# Patient Record
Sex: Male | Born: 1990 | Race: White | Hispanic: No | Marital: Married | Smoking: Never smoker
Health system: Southern US, Community
[De-identification: ages and names within clinical notes are randomized; demographics above are authoritative.]

## PROBLEM LIST (undated history)

## (undated) DIAGNOSIS — I514 Myocarditis, unspecified: Secondary | ICD-10-CM

---
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
## 2018-10-03 ENCOUNTER — Emergency Department (HOSPITAL_COMMUNITY): Payer: 59

## 2018-10-03 ENCOUNTER — Emergency Department (HOSPITAL_COMMUNITY)
Admission: EM | Admit: 2018-10-03 | Discharge: 2018-10-03 | Disposition: A | Discharge status: Home / Self Care | Payer: 59 | Attending: Emergency Medicine | Admitting: Emergency Medicine

## 2018-10-03 ENCOUNTER — Other Ambulatory Visit: Payer: Self-pay

## 2018-10-03 ENCOUNTER — Encounter (HOSPITAL_COMMUNITY): Payer: Self-pay

## 2018-10-03 DIAGNOSIS — N50812 Left testicular pain: Secondary | ICD-10-CM | POA: Diagnosis present

## 2018-10-03 DIAGNOSIS — N451 Epididymitis: Secondary | ICD-10-CM | POA: Diagnosis not present

## 2018-10-03 DIAGNOSIS — N50819 Testicular pain, unspecified: Secondary | ICD-10-CM

## 2018-10-03 MED ORDER — IBUPROFEN 600 MG PO TABS: 600.0000 mg | ORAL_TABLET | Freq: Three times a day (TID) | ORAL | 0 refills | Status: AC | PRN

## 2018-10-03 MED ORDER — CIPROFLOXACIN HCL 500 MG PO TABS: 500.0000 mg | ORAL_TABLET | Freq: Two times a day (BID) | ORAL | 0 refills | Status: AC

## 2018-10-03 MED ORDER — OXYCODONE-ACETAMINOPHEN 5-325 MG PO TABS
1.0000 | ORAL_TABLET | Freq: Once | ORAL | Status: AC
  Administered 2018-10-03: 1 via ORAL
  Filled 2018-10-03: qty 1

## 2018-10-03 MED ORDER — IBUPROFEN 400 MG PO TABS
600.0000 mg | ORAL_TABLET | Freq: Once | ORAL | Status: AC
  Administered 2018-10-03: 600 mg via ORAL
  Filled 2018-10-03: qty 1

## 2018-10-03 NOTE — ED Triage Notes (Signed)
Pt reports left testicle pain since Sunday and has worsened since then. [facility] was [facility] from [facility]. Denies any urinary symptoms.

## 2018-10-03 NOTE — ED Notes (Signed)
Patient transported to [facility] 

## 2018-10-03 NOTE — ED Provider Notes (Signed)
[facility] EMERGENCY DEPARTMENT Provider Note   CSN: [id number] Arrival date & time: 10/03/18  1750     History   Chief Complaint Chief Complaint  Patient presents with   Testicle Pain    HPI Alfred Perry is a 28 y.o. male.     HPI Patient is a 28 year old male presents the emergency department the gradual worsening of his left testicular pain over the past 4 days.  He is never had pain like this before.  No new sexual partners.  Denies penile discharge.  No dysuria or urinary frequency.  Unsure if he has any new testicular swelling.  No significant abdominal pain.  Denies fevers or chills.  No nausea vomiting.  Patient is never had pain or discomfort like this before.   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   ** The histories are not reviewed yet. Please review them in the "History" navigator section and refresh this [place].      Home Medications    Prior to Admission medications   Medication Sig Start Date End Date Taking? Authorizing Provider  ciprofloxacin (CIPRO) 500 MG tablet Take 1 tablet (500 mg total) by mouth 2 (two) times daily. 10/03/18   Jola Schmidt, MD  ibuprofen (ADVIL) 600 MG tablet Take 1 tablet (600 mg total) by mouth every 8 (eight) hours as needed. 10/03/18   Jola Schmidt, MD    Family History No family history on file.  Social History Social History   Tobacco Use   Smoking status: Not on file  Substance Use Topics   Alcohol use: Not on file   Drug use: Not on file     Allergies   Patient has no allergy information on record.   Review of Systems Review of Systems  All other systems reviewed and are negative.    Physical Exam Updated Vital Signs BP (!) 138/96 (BP Location: Right Arm)    Pulse 60    Temp 98.5 F (36.9 C) (Oral)    Resp 18    SpO2 100%   Physical Exam Vitals signs and nursing note reviewed.    Constitutional:      Appearance: He is well-developed.  HENT:     Head: Normocephalic.  Neck:     Musculoskeletal: Normal range of motion.  Pulmonary:     Effort: Pulmonary effort is normal.  Abdominal:     General: There is no distension.     Tenderness: There is no abdominal tenderness.  Genitourinary:    Comments: Normal circumcised penis.  Normal appearing scrotum.  Mild tenderness of the left testicle without significant swelling noted.  Mild tenderness at the superior pole of the left testicle as well.  No left inguinal hernia present. Musculoskeletal: Normal range of motion.  Neurological:     Mental Status: He is alert and oriented to person, place, and time.      ED Treatments / Results  Labs (all labs ordered are listed, but only abnormal results are displayed) Labs Reviewed - No data to display  EKG None  Radiology US Scrotum W/doppler  Result Date: 10/03/2018 CLINICAL DATA:  LEFT testicular pain for 5 days. EXAM: SCROTAL ULTRASOUND DOPPLER ULTRASOUND OF THE TESTICLES TECHNIQUE: Complete ultrasound examination of the testicles, epididymis, and other scrotal structures was performed. Color and spectral Doppler ultrasound were also utilized to evaluate blood flow to the testicles. COMPARISON:  None. FINDINGS:  Right testicle Measurements: 5.0 x 2.6 x 3.4 centimeters. No mass or microlithiasis visualized. Left testicle Measurements: 4.9 x 3.0 x 3.4 centimeters. No mass or microlithiasis visualized. Right epididymis:  Normal in size and appearance. Left epididymis:  Small cyst is 0.8 x 0.7 x 0.6 centimeters. Hydrocele:  Small bilateral hydroceles. Varicocele:  Bilateral varicoceles present. Pulsed Doppler interrogation of both testes demonstrates normal low resistance arterial and venous waveforms bilaterally. IMPRESSION: 1. No evidence for testicular torsion or mass. 2. Small bilateral hydroceles. 3. Bilateral varicoceles. 4. Small LEFT epididymal cyst. Electronically Signed    By: Norva PavlovElizabeth  Brown M.D.   On: 10/03/2018 19:07    Procedures Procedures (including critical care time)  Medications Ordered in ED Medications  oxyCODONE-acetaminophen (PERCOCET/ROXICET) 5-325 MG per tablet 1 tablet (1 tablet Oral Given 10/03/18 1818)  ibuprofen (ADVIL) tablet 600 mg (600 mg Oral Given 10/03/18 1818)     Initial Impression / Assessment and Plan / ED Course  I have reviewed the triage vital signs and the nursing notes.  Pertinent labs & imaging results that were available during my care of the patient were reviewed by me and considered in my medical decision making (see chart for details).        Normal flow.  Given the tenderness within the left testicle I suspect this is epididymitis/orchitis.  Patient will be placed on ciprofloxacin.  No new sexual partners.  No penile discharge.  Home with ciprofloxacin.  Urology follow-up if not improving  Final Clinical Impressions(s) / ED Diagnoses   Final diagnoses:  Testicle pain  Epididymitis    ED Discharge Orders         Ordered    ibuprofen (ADVIL) 600 MG tablet  Every 8 hours PRN     10/03/18 1919    ciprofloxacin (CIPRO) 500 MG tablet  2 times daily     10/03/18 1919           Azalia Bilisampos, Kalai Baca, MD 10/03/18 Ernestina [id number]

## 2019-07-12 ENCOUNTER — Other Ambulatory Visit: Payer: Self-pay

## 2019-07-12 ENCOUNTER — Encounter (HOSPITAL_COMMUNITY): Payer: Self-pay | Admitting: Emergency Medicine

## 2019-07-12 ENCOUNTER — Emergency Department (HOSPITAL_COMMUNITY)
Admission: EM | Admit: 2019-07-12 | Discharge: 2019-07-12 | Disposition: A | Discharge status: Home / Self Care | Payer: 59 | Attending: Emergency Medicine | Admitting: Emergency Medicine

## 2019-07-12 ENCOUNTER — Emergency Department (HOSPITAL_COMMUNITY): Payer: 59

## 2019-07-12 DIAGNOSIS — M25561 Pain in right knee: Secondary | ICD-10-CM | POA: Insufficient documentation

## 2019-07-12 LAB — CBG MONITORING, ED: Glucose-Capillary: 101 mg/dL — ABNORMAL HIGH (ref 70–99)

## 2019-07-12 MED ORDER — OXYCODONE-ACETAMINOPHEN 5-325 MG PO TABS
2.0000 | ORAL_TABLET | Freq: Once | ORAL | Status: AC
  Administered 2019-07-12: 15:00:00 2 via ORAL
  Filled 2019-07-12: qty 2

## 2019-07-12 MED ORDER — OXYCODONE-ACETAMINOPHEN 5-325 MG PO TABS: 2.0000 | ORAL_TABLET | ORAL | 0 refills | Status: AC | PRN

## 2019-07-12 NOTE — Discharge Instructions (Addendum)
Please call [facility] Monday morning.  Discussed with them your symptoms and that you were seen in the ER and have concern for meniscal tear.  He will be sent into their appointment schedule as soon as possible.  Please wear knee immobilizer and use crutches.  I have prescribed you several pain pills.  Please use these sparingly and only as needed.  Please use Tylenol or ibuprofen for pain.  You may use 600 mg ibuprofen every 6 hours or 1000 mg of Tylenol every 6 hours.  You may choose to alternate between the 2.  This would be most effective.  Not to exceed 4 g of Tylenol within 24 hours.  Not to exceed 3200 mg ibuprofen 24 hours.

## 2019-07-12 NOTE — ED Notes (Signed)
Pt reports 3-4 pain at rest, 10 w/ movement. Similar previous experience w/ left knee, torn meniscus needing surgery.

## 2019-07-12 NOTE — ED Triage Notes (Signed)
Pt states R knee popped while he was sitting in car and now has severe pain.

## 2019-07-12 NOTE — ED Provider Notes (Signed)
[facility] EMERGENCY DEPARTMENT Provider Note   CSN: [id number] Arrival date & time: 07/12/19  1139     History Chief Complaint  Patient presents with  . Knee Pain    Alfred Perry is a 29 y.o. male.  HPI Patient is 29 year old male with past medical history pertinent for left-sided meniscal tear that required surgery in 2010  Patient states he was in the car today as a passenger when he was moving around and he felt his right knee pop and had sudden onset of excruciating right knee pain.  He states is the worst pain he has ever had.  He states it feels very similar to when he tore his meniscus 11 years ago in his left knee.  He states is been constant since.  He states he is unable to move his knee.  Denies any other injuries.  Denies any calf pain, thigh pain, trauma.  Denies any headache chest pain or dizziness.     Past Medical History:  Diagnosis Date  . 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   Past Surgical History:  Procedure Laterality Date  . KNEE SURGERY Left 2010       No family history on file.  Social History   Tobacco Use  . Smoking status: Never Smoker  . Smokeless tobacco: Never Used  Substance Use Topics  . Alcohol use: Yes  . Drug use: Not Currently    Home Medications Prior to Admission medications   Medication Sig Start Date End Date Taking? Authorizing Provider  ciprofloxacin (CIPRO) 500 MG tablet Take 1 tablet (500 mg total) by mouth 2 (two) times daily. 10/03/18   Jola Schmidt, MD  ibuprofen (ADVIL) 600 MG tablet Take 1 tablet (600 mg total) by mouth every 8 (eight) hours as needed. 10/03/18   Jola Schmidt, MD  oxyCODONE-acetaminophen (PERCOCET/ROXICET) 5-325 MG tablet Take 2 tablets by mouth every 4 (four) hours as needed for severe pain. 07/12/19   Tedd Sias, PA    Allergies    Patient has no allergy information on record.  Review of Systems   Review of Systems    Constitutional: Negative for chills and fever.  HENT: Negative for congestion.   Respiratory: Negative for shortness of breath.   Cardiovascular: Negative for chest pain.  Gastrointestinal: Negative for abdominal pain.  Musculoskeletal: Negative for neck pain.       Knee pain, right  Skin: Negative for wound.    Physical Exam Updated Vital Signs BP 129/82 (BP Location: Right Arm)   Pulse 84   Temp 97.8 F (36.6 C) (Oral)   Resp 18   SpO2 100%   Physical Exam Vitals and nursing note reviewed.  Constitutional:      General: He is in acute distress.     Appearance: Normal appearance. He is not ill-appearing.  HENT:     Head: Normocephalic and atraumatic.     Mouth/Throat:     Mouth: Mucous membranes are moist.  Eyes:     General: No scleral icterus.       Right eye: No discharge.        Left eye: No discharge.     Conjunctiva/sclera: Conjunctivae normal.  Cardiovascular:     Comments: DP/PT pulses normal. Pulmonary:     Effort: Pulmonary effort is normal.     Breath sounds: No stridor.  Musculoskeletal:     Comments: Tenderness to palpation of the right knee.  Patient is unwilling to straighten or flex his right knee however he is able to resist my movements for range of motion evaluation.  By this metric he has 4/5 strength.  I am able to move his knee approximately 30 degrees however he is keeping it flexed at 90 degrees at rest.  Skin:    General: Skin is warm and dry.     Capillary Refill: Capillary refill takes less than 2 seconds.  Neurological:     Mental Status: He is alert and oriented to person, place, and time. Mental status is at baseline.     Comments: Sensation intact bilateral lower extremities.     ED Results / Procedures / Treatments   Labs (all labs ordered are listed, but only abnormal results are displayed) Labs Reviewed  CBG MONITORING, ED - Abnormal; Notable for the following components:      Result Value   Glucose-Capillary 101 (*)    All  other components within normal limits    EKG None  Radiology DG Knee Complete 4 Views Right  Result Date: 07/12/2019 CLINICAL DATA:  Acute RIGHT knee pain today.  Initial encounter. EXAM: RIGHT KNEE - COMPLETE 4+ VIEW COMPARISON:  None. FINDINGS: No evidence of fracture, dislocation, or joint effusion. No evidence of arthropathy or other focal bone abnormality. Soft tissues are unremarkable. IMPRESSION: Negative. Electronically Signed   By: Harmon Pier M.D.   On: 07/12/2019 13:03    Procedures Procedures (including critical care time)  Medications Ordered in ED Medications  oxyCODONE-acetaminophen (PERCOCET/ROXICET) 5-325 MG per tablet 2 tablet (2 tablets Oral Given 07/12/19 1527)    ED Course  I have reviewed the triage vital signs and the nursing notes.  Pertinent labs & imaging results that were available during my care of the patient were reviewed by me and considered in my medical decision making (see chart for details).    MDM Rules/Calculators/A&P                      Patient with right knee pain.  Concern for fracture is very low considering mechanism of injury.  Some concern for ligamentous damage however he has strength in his knee on exam.  Unable to range his knee extensively secondary to pain.  I discussed this case with my attending physician who cosigned this note including patient's presenting symptoms, physical exam, and planned diagnostics and interventions. Attending physician stated agreement with plan or made changes to plan which were implemented.    Patient placed in knee immobilizer, and provided with crutches and opioid pain medication of short duration for pain.  Recommend Tylenol ibuprofen and given orthopedic follow-up.   Final Clinical Impression(s) / ED Diagnoses Final diagnoses:  Acute pain of right knee    Rx / DC Orders ED Discharge Orders         Ordered    oxyCODONE-acetaminophen (PERCOCET/ROXICET) 5-325 MG tablet  Every 4 hours PRN      07/12/19 1536           Gailen Shelter, [place] 07/13/19 1317    Little, Ambrose Finland, MD 07/14/19 [phone]

## 2019-07-12 NOTE — Progress Notes (Signed)
Orthopedic Tech Progress Note Patient Details:  Alfred Perry 12/09/1990 [id number]  Ortho Devices Type of Ortho Device: Knee Immobilizer Ortho Device/Splint Interventions: Application   Post Interventions Patient Tolerated: [name] 07/12/2019, 7:09 PM

## 2019-07-12 NOTE — ED Notes (Signed)
Patient verbalizes understanding of discharge instructions. Opportunity for questioning and answers were provided. Armband removed by staff. Patient discharged from ED.  

## 2021-01-20 IMAGING — CR DG KNEE COMPLETE 4+V*R*
4 series · 4 of 4 positions shown · non-contrast
Comparison: None.

CLINICAL DATA: Acute RIGHT knee pain today.  Initial encounter.

EXAM:
RIGHT KNEE - COMPLETE 4+ VIEW

[knee ap]
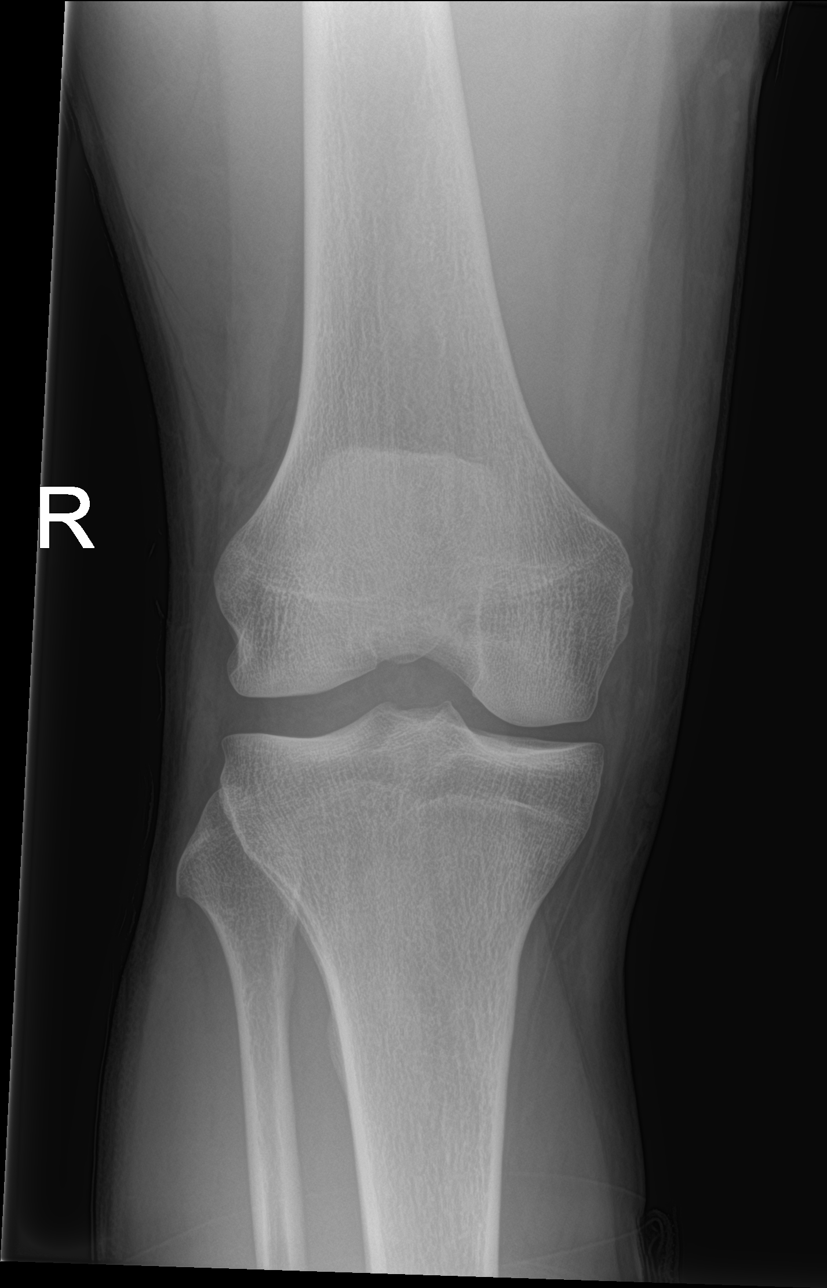

[knee lat]
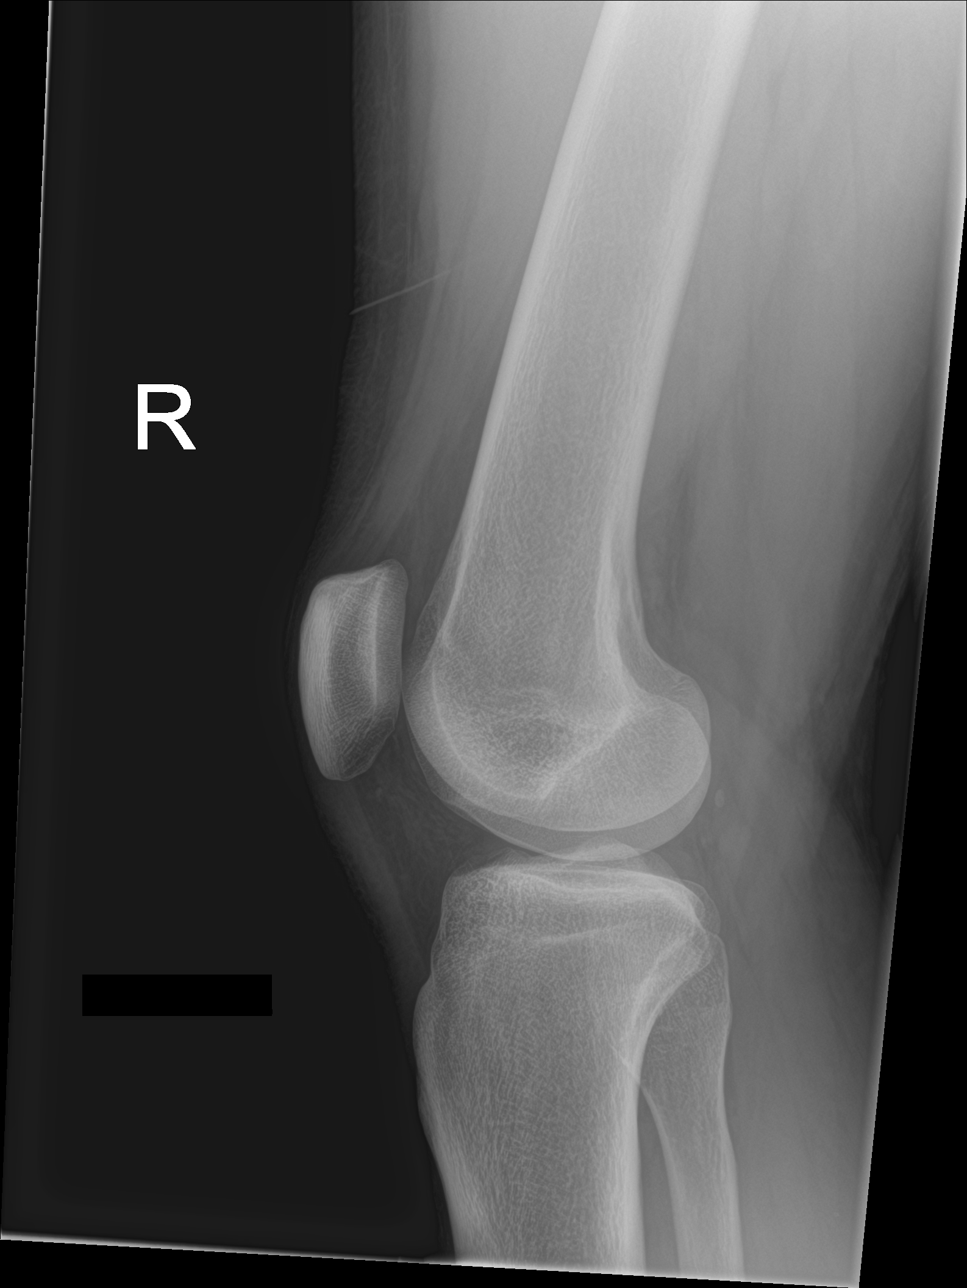

[knee obl (1 of 2)]
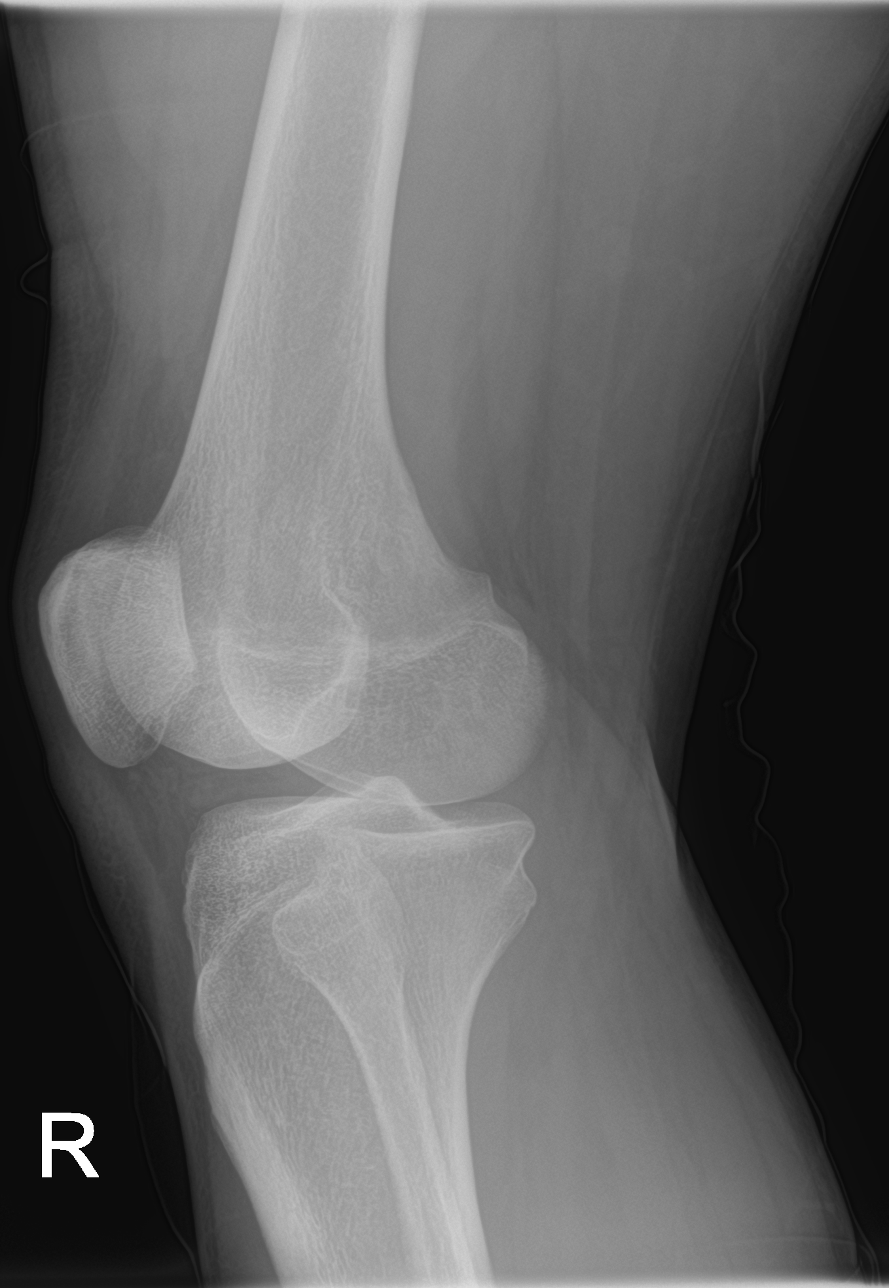

[knee obl (2 of 2)]
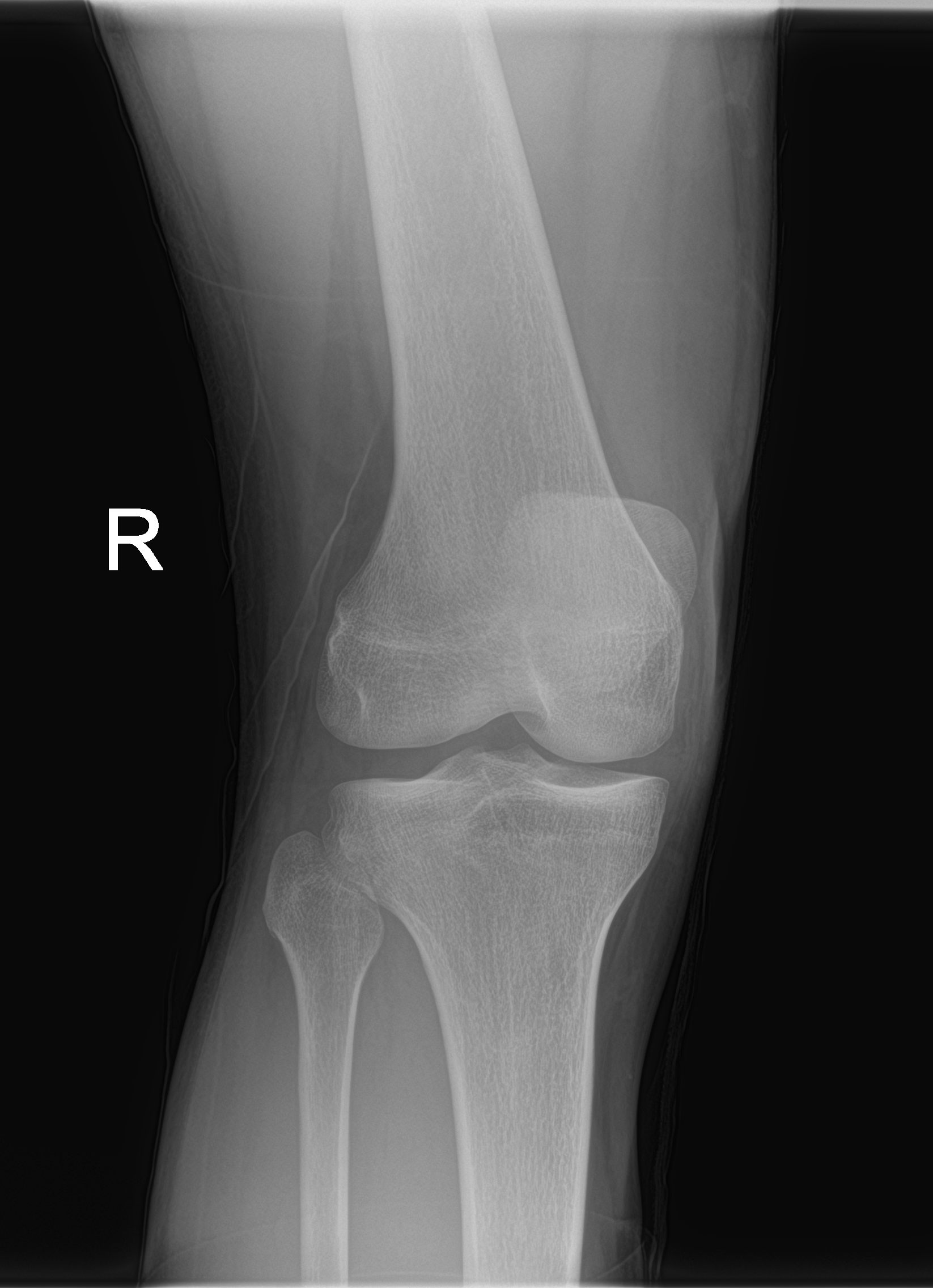

[4 of 4 positions shown; findings below may reference images not displayed]

FINDINGS: No evidence of fracture, dislocation, or joint effusion. No evidence
of arthropathy or other focal bone abnormality. Soft tissues are
unremarkable.
IMPRESSION: Negative.
